# Patient Record
Sex: Male | Born: 2000 | Race: White | Hispanic: No | Marital: Single | Smoking: Never smoker
Health system: Southern US, Community
[De-identification: ages and names within clinical notes are randomized; demographics above are authoritative.]

## PROBLEM LIST (undated history)

## (undated) DIAGNOSIS — F909 Attention-deficit hyperactivity disorder, unspecified type: Secondary | ICD-10-CM

---
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
## 2023-08-04 ENCOUNTER — Ambulatory Visit: Payer: Medicaid Other | Admitting: Podiatry
